# Patient Record
Sex: Female | Born: 2005 | Race: Black or African American | Hispanic: No | Marital: Single | Smoking: Never smoker
Health system: Southern US, Community
[De-identification: ages and names within clinical notes are randomized; demographics above are authoritative.]

## PROBLEM LIST (undated history)

## (undated) ENCOUNTER — Ambulatory Visit (HOSPITAL_COMMUNITY)

## (undated) DIAGNOSIS — J45909 Unspecified asthma, uncomplicated: Secondary | ICD-10-CM

---
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
## 2024-04-16 ENCOUNTER — Ambulatory Visit (INDEPENDENT_AMBULATORY_CARE_PROVIDER_SITE_OTHER)

## 2024-04-16 ENCOUNTER — Ambulatory Visit (HOSPITAL_COMMUNITY)
Admission: EM | Admit: 2024-04-16 | Discharge: 2024-04-16 | Disposition: A | Discharge status: Home / Self Care | Attending: Family Medicine | Admitting: Family Medicine

## 2024-04-16 ENCOUNTER — Encounter (HOSPITAL_COMMUNITY): Payer: Self-pay

## 2024-04-16 DIAGNOSIS — S99921A Unspecified injury of right foot, initial encounter: Secondary | ICD-10-CM

## 2024-04-16 DIAGNOSIS — S91209A Unspecified open wound of unspecified toe(s) with damage to nail, initial encounter: Secondary | ICD-10-CM | POA: Diagnosis not present

## 2024-04-16 MED ORDER — BUPIVACAINE HCL (PF) 0.5 % IJ SOLN
INTRAMUSCULAR | Status: AC
  Filled 2024-04-16: qty 10

## 2024-04-16 NOTE — ED Provider Notes (Signed)
 [facility]    CSN: [id number] Arrival date & time: 04/16/24  1705      History   Chief Complaint Chief Complaint  Patient presents with   Toe Injury    HPI Jillian Cole is a 18 y.o. female.   The history is provided by the patient.  Injury right middle toe onset 2 days ago accidentally hit toe on a bench then today hurt it on a trampoline.  States the nail has lifted up and she has had some bleeding.  Admits local swelling and tenderness.  Denies deformity, paresthesias, other injury/ No over-the-counter remedies tried.  No significant past medical history, no known drug allergies, LMP 04/14/2024  Past Medical History:  Diagnosis Date   Asthma     There are no active problems to display for this patient.   History reviewed. No pertinent surgical history.  OB History   No obstetric history on file.      Home Medications    Prior to Admission medications   Not on File    Family History History reviewed. No pertinent family history.  Social History Social History   Tobacco Use   Smoking status: Never   Smokeless tobacco: Never  Vaping Use   Vaping status: Never Used  Substance Use Topics   Alcohol use: Never   Drug use: Never     Allergies   Patient has no known allergies.   Review of Systems Review of Systems   Physical Exam Triage Vital Signs ED Triage Vitals  Encounter Vitals Group     BP 04/16/24 1754 116/73     Girls Systolic BP Percentile --      Girls Diastolic BP Percentile --      Boys Systolic BP Percentile --      Boys Diastolic BP Percentile --      Pulse Rate 04/16/24 1754 88     Resp 04/16/24 1754 16     Temp 04/16/24 1754 98.4 F (36.9 C)     Temp Source 04/16/24 1754 Oral     SpO2 04/16/24 1754 98 %     Weight --      Height 04/16/24 1754 5' 3 (1.6 m)     Head Circumference --      Peak Flow --      Pain Score 04/16/24 1752 9     Pain Loc --      Pain Education --      Exclude from Growth Chart --     No data found.  Updated Vital Signs BP 116/73 (BP Location: Right Arm)   Pulse 88   Temp 98.4 F (36.9 C) (Oral)   Resp 16   Ht 5' 3 (1.6 m)   LMP 04/14/2024 (Approximate)   SpO2 98%   Visual Acuity Right Eye Distance:   Left Eye Distance:   Bilateral Distance:    Right Eye Near:   Left Eye Near:    Bilateral Near:     Physical Exam Vitals and nursing note reviewed.  HENT:     Head: Normocephalic and atraumatic.   Musculoskeletal:     Comments: Right middle toe mild swelling and tenderness, nail avulsed, no active drainage, no deformity, neurovascular intact rest of right foot exam is normal   Neurological:     Mental Status: She is alert.      UC Treatments / Results  Labs (all labs ordered are listed, but only abnormal results are displayed) Labs Reviewed - No  data to display  EKG   Radiology No results found.  Procedures Procedures (including critical care time)  Medications Ordered in UC Medications - No data to display  Initial Impression / Assessment and Plan / UC Course  I have reviewed the triage vital signs and the nursing notes.  Pertinent labs & imaging results that were available during my care of the patient were reviewed by me and considered in my medical decision making (see chart for details).     18 year old female with injury to right third toe struck it twice over the last 3 days has nail avulsion.  She has local pain swelling and tenderness, x-ray obtained no fracture, digital nerve block performed with 0.5% Marcaine dorsal approach 1.5 mL with good results.  Once control was obtained, toenail was trimmed, proximal portion of nail replaced into position, Steri-Strip applied with benzoin. Home care and follow-up discussed recommend keeping area clean and dry for several days, ice and elevation for pain control OTC NSAIDs.  Follow-up as needed Final Clinical Impressions(s) / UC Diagnoses   Final diagnoses:  Injury of toe on right  foot, initial encounter   Discharge Instructions   None    ED Prescriptions   None    PDMP not reviewed this encounter.   S[place], [place] 04/16/24 1924

## 2024-04-16 NOTE — ED Triage Notes (Signed)
 Patient presenting with right toe injury onset 2 days ago. State the toe got jammed against a table 2 days ago. Then at the trampoline park today someone collided with the same toe and the nail was ripped off. States the injured toe is the middle toe on the right foot.   Prescriptions or OTC medications tried: No

## 2024-04-16 NOTE — Discharge Instructions (Signed)
 Over the counter NSAIDs for pain: Ibuprofen 400 mg ( Ibuprofen, Advil or Motrin, 2 tablets) and Acetaminophen 1 gram (3 of the regular strength or 2 extra strength tablets) 3-4 times a day,  take on an empty stomach before each meal and bedtime (every 6 hours) for a few days Do not take if you are pregnant/breastfeeding. Allergic to NSAIDs have a history of ulcers, intestinal bleeding or liver or kidney disease or have taken an opioid.

## 2024-04-24 ENCOUNTER — Ambulatory Visit (INDEPENDENT_AMBULATORY_CARE_PROVIDER_SITE_OTHER)

## 2024-04-24 ENCOUNTER — Encounter (HOSPITAL_COMMUNITY): Payer: Self-pay

## 2024-04-24 ENCOUNTER — Ambulatory Visit (HOSPITAL_COMMUNITY): Admission: EM | Admit: 2024-04-24 | Discharge: 2024-04-24 | Disposition: A | Discharge status: Home / Self Care

## 2024-04-24 DIAGNOSIS — S6992XA Unspecified injury of left wrist, hand and finger(s), initial encounter: Secondary | ICD-10-CM

## 2024-04-24 DIAGNOSIS — S63637A Sprain of interphalangeal joint of left little finger, initial encounter: Secondary | ICD-10-CM | POA: Diagnosis not present

## 2024-04-24 DIAGNOSIS — S91209A Unspecified open wound of unspecified toe(s) with damage to nail, initial encounter: Secondary | ICD-10-CM

## 2024-04-24 MED ORDER — IBUPROFEN 600 MG PO TABS: 600.0000 mg | ORAL_TABLET | Freq: Four times a day (QID) | ORAL | 0 refills | Status: DC | PRN

## 2024-04-24 NOTE — Discharge Instructions (Signed)
  1. Avulsion of toenail of right foot (Primary) - Nail Removal performed in UC, no toenail retained, nailbed appears to be healing appropriately, no sign of secondary infection or complication  2. Sprain of interphalangeal joint of left little finger, initial encounter - DG Finger Little Left x-ray performed in UC shows no acute fracture or dislocation, most likely finger sprain. - Apply finger splint static for protection and immobilization until injury heals -Continue to monitor symptoms for any change in severity if there is any escalation of current symptoms or development of new symptoms follow-up in ER for further evaluation and management.

## 2024-04-24 NOTE — ED Triage Notes (Signed)
 Patient presenting with right middle toe nail was injured before but she hit the nail again onset today. Also presenting with left hand pinky finger pain after playing volleyball 3 days ago.   Prescriptions or OTC medications tried: No

## 2024-04-24 NOTE — ED Provider Notes (Signed)
 [facility]  Note:  This document was prepared using Dragon voice recognition software and may include unintentional dictation errors.  MRN: [id number] DOB: October 18, 2006  Subjective:   Jillian Cole is a 18 y.o. female presenting for evaluation of left pinky finger swelling and bruising after playing volleyball 2 to 3 days ago.  Patient reports that she felt her finger bent back when she was trying to hit the ball.  Patient has good range of motion no significant joint swelling to interphalangeal joints.  Patient denies taking any over-the-counter medication to treat symptoms.  Patient also reports that she has a previous right third toe injury to her toenail.  Patient reports that toenail keeps hanging on her shoes and socks and would like it removed.  Denies any bleeding, pain, purulent drainage from the area, swelling, redness, warmth.  Patient was told that toenail would fall off eventually on its own is barely hanging on in his catching on her clothing.  I was just she had drops of the marginal previous. I just removed all  No current facility-administered medications for this encounter.  Current Outpatient Medications:    ibuprofen (ADVIL) 600 MG tablet, Take 1 tablet (600 mg total) by mouth every 6 (six) hours as needed., Disp: 30 tablet, Rfl: 0   No Known Allergies  Past Medical History:  Diagnosis Date   Asthma      History reviewed. No pertinent surgical history.  History reviewed. No pertinent family history.  Social History   Tobacco Use   Smoking status: Never   Smokeless tobacco: Never  Vaping Use   Vaping status: Never Used  Substance Use Topics   Alcohol use: Never   Drug use: Never    ROS Refer to HPI for ROS details.  Objective:   Vitals: BP 123/64 (BP Location: Right Arm)   Pulse 75   Temp 98.3 F (36.8 C) (Oral)   Resp 16   Ht 5' 3 (1.6 m)   LMP 04/14/2024 (Approximate)   SpO2 99%   Physical Exam Vitals and nursing note  reviewed.  Constitutional:      General: She is not in acute distress.    Appearance: Normal appearance. She is well-developed. She is not ill-appearing or toxic-appearing.  HENT:     Head: Normocephalic and atraumatic.   Cardiovascular:     Rate and Rhythm: Normal rate.  Pulmonary:     Effort: Pulmonary effort is normal. No respiratory distress.   Musculoskeletal:        General: Normal range of motion.     Left hand: Tenderness (Moderate bruising and pain to left fifth finger after playing volleyball 2 to 3 days ago.) and bony tenderness present. No swelling. Normal range of motion. Normal strength. Normal sensation. Normal capillary refill. Normal pulse.       Feet:  Feet:     Left foot:     Skin integrity: Skin integrity normal.   Skin:    General: Skin is warm and dry.   Neurological:     General: No focal deficit present.     Mental Status: She is alert and oriented to person, place, and time.   Psychiatric:        Mood and Affect: Mood normal.        Behavior: Behavior normal.     Nail Removal  Date/Time: 04/24/2024 3:51 PM  Performed by: Aurea Ethel NOVAK, NP Authorized by: Aurea Ethel NOVAK, NP   Consent:    Consent obtained:  Verbal   Consent given by:  Patient   Risks discussed:  Bleeding, infection and pain Universal protocol:    Patient identity confirmed:  Verbally with patient and arm band Pre-procedure details:    Skin preparation:  Povidone-iodine   Preparation: Patient was prepped and draped in the usual sterile fashion   Procedure details:    Location:  Foot   Foot location:  R third toe Anesthesia:    Anesthesia method:  None Nail Removal:    Nail removed:  Complete Trephination:    Subungual hematoma drained: no   Post-procedure details:    Dressing:  Antibiotic ointment   Procedure completion:  Tolerated well, no immediate complications   No results found for this or any previous visit (from the past 24 hours).  DG Finger  Little Left Result Date: 04/24/2024 CLINICAL DATA:  Finger injury EXAM: LEFT FINGER(S) - 3 VIEW COMPARISON:  None Available. FINDINGS: There is no evidence of fracture or dislocation. There is no evidence of arthropathy or other focal bone abnormality. Soft tissues are unremarkable. IMPRESSION: No acute fracture or dislocation. Electronically Signed   By: Limin  Xu M.D.   On: 04/24/2024 16:39     Assessment and Plan :     Discharge Instructions       1. Avulsion of toenail of right foot (Primary) - Nail Removal performed in UC, no toenail retained, nailbed appears to be healing appropriately, no sign of secondary infection or complication  2. Sprain of interphalangeal joint of left little finger, initial encounter - DG Finger Little Left x-ray performed in UC shows no acute fracture or dislocation, most likely finger sprain. - Apply finger splint static for protection and immobilization until injury heals -Continue to monitor symptoms for any change in severity if there is any escalation of current symptoms or development of new symptoms follow-up in ER for further evaluation and management.      Estie Sproule B Joelie Schou   [place], [place], [place] 04/24/24 1742

## 2024-05-04 ENCOUNTER — Ambulatory Visit (HOSPITAL_COMMUNITY)
Admission: EM | Admit: 2024-05-04 | Discharge: 2024-05-04 | Disposition: A | Discharge status: Home / Self Care | Attending: Emergency Medicine | Admitting: Emergency Medicine

## 2024-05-04 ENCOUNTER — Encounter (HOSPITAL_COMMUNITY): Payer: Self-pay

## 2024-05-04 DIAGNOSIS — J069 Acute upper respiratory infection, unspecified: Secondary | ICD-10-CM | POA: Diagnosis not present

## 2024-05-04 DIAGNOSIS — Z09 Encounter for follow-up examination after completed treatment for conditions other than malignant neoplasm: Secondary | ICD-10-CM | POA: Diagnosis not present

## 2024-05-04 MED ORDER — AZELASTINE HCL 0.1 % NA SOLN: 2.0000 | Freq: Two times a day (BID) | NASAL | 0 refills | Status: DC

## 2024-05-04 MED ORDER — BENZONATATE 100 MG PO CAPS: 100.0000 mg | ORAL_CAPSULE | Freq: Three times a day (TID) | ORAL | 0 refills | Status: DC

## 2024-05-04 NOTE — Discharge Instructions (Signed)
 As discussed I believe her symptoms are likely related to a viral respiratory illness. Take Tessalon every 8 hours as needed for cough. Use azelastine spray twice daily to help with congestion. Otherwise you can take over-the-counter Mucinex to help with your cough and congestion.  Your finger seems to be healing well.  You can continue to use the finger splint to help stabilize the joint or for comfort as needed.  In a few days I would stop wearing the splint and begin using your finger as usual to avoid stiffness or increased pain. Alternate between 650 mg of Tylenol and 400 mg ibuprofen  for any pain. Follow-up with your primary care provider or return here as needed.

## 2024-05-04 NOTE — ED Triage Notes (Signed)
 Chief Complaint: cough that is productive, congestion, runny nose, slight sore throat. Denies any fevers. Also having ongoing left pinky finger pain (sprained).   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   Onset: 1.5 weeks   Prescriptions or OTC medications tried: Yes- Alka seltzer plus cough and cold    with mild relief  New foods, medications, or products: No  Recent Travel: No

## 2024-05-04 NOTE — ED Provider Notes (Signed)
 [facility]    CSN: [id number] Arrival date & time: 05/04/24  1646      History   Chief Complaint Chief Complaint  Patient presents with   Cough   Hand Pain    HPI Jillian Cole is a 18 y.o. female.   Patient presents with productive cough, congestion, runny nose, and mild sore throat for about a week and a half.  Patient denies fever, body aches, chills, shortness of breath, chest pain, nausea, vomiting, diarrhea, and abdominal pain.    Patient states that she has been taking Alka-Seltzer plus with minimal relief.  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  Patient would also like a reevaluation of her left pinky finger.  Patient was seen here on 6/21 and was told that she sprained her pinky finger at that time.  Patient states that she has been wearing the finger splint and taking ibuprofen  and Tylenol with relief.  Patient denies any new or worsening pain.  Patient denies any swelling.  The history is provided by the patient and medical records.  Cough Hand Pain    Past Medical History:  Diagnosis Date   Asthma     There are no active problems to display for this patient.   History reviewed. No pertinent surgical history.  OB History   No obstetric history on file.      Home Medications    Prior to Admission medications   Medication Sig Start Date End Date Taking? Authorizing Provider  azelastine (ASTELIN) 0.1 % nasal spray Place 2 sprays into both nostrils 2 (two) times daily. Use in each nostril as directed 05/04/24  Yes Johnie Flaming A, NP  benzonatate (TESSALON) 100 MG capsule Take 1 capsule (100 mg total) by mouth every 8 (eight) hours. 05/04/24  Yes Johnie, Evella Kasal A, NP  ibuprofen  (ADVIL ) 600 MG tablet Take 1 tablet (600 mg total) by mouth every 6 (six) hours as needed. 04/24/24  Yes Reddick, Ethel NOVAK, NP    Family History History reviewed. No pertinent family history.  Social  History Social History   Tobacco Use   Smoking status: Never   Smokeless tobacco: Never  Vaping Use   Vaping status: Never Used  Substance Use Topics   Alcohol use: Never   Drug use: Never     Allergies   Patient has no known allergies.   Review of Systems Review of Systems  Respiratory:  Positive for cough.    Per HPI  Physical Exam Triage Vital Signs ED Triage Vitals  Encounter Vitals Group     BP 05/04/24 1700 124/78     Girls Systolic BP Percentile --      Girls Diastolic BP Percentile --      Boys Systolic BP Percentile --      Boys Diastolic BP Percentile --      Pulse Rate 05/04/24 1700 93     Resp 05/04/24 1700 18     Temp 05/04/24 1700 98 F (36.7 C)     Temp Source 05/04/24 1700 Oral     SpO2 05/04/24 1700 97 %     Weight --      Height 05/04/24 1700 5' 3 (1.6 m)     Head Circumference --      Peak Flow --      Pain Score 05/04/24 1659 5     Pain Loc --      Pain Education --  Exclude from Growth Chart --    No data found.  Updated Vital Signs BP 124/78 (BP Location: Left Arm)   Pulse 93   Temp 98 F (36.7 C) (Oral)   Resp 18   Ht 5' 3 (1.6 m)   LMP 04/14/2024 (Approximate)   SpO2 97%   Visual Acuity Right Eye Distance:   Left Eye Distance:   Bilateral Distance:    Right Eye Near:   Left Eye Near:    Bilateral Near:     Physical Exam Vitals and nursing note reviewed.  Constitutional:      General: She is awake. She is not in acute distress.    Appearance: Normal appearance. She is well-developed and well-groomed. She is not ill-appearing.  HENT:     Right Ear: Tympanic membrane, ear canal and external ear normal.     Left Ear: Tympanic membrane, ear canal and external ear normal.     Nose: Congestion and rhinorrhea present.     Mouth/Throat:     Mouth: Mucous membranes are moist.     Pharynx: Posterior oropharyngeal erythema and postnasal drip present.   Cardiovascular:     Rate and Rhythm: Normal rate and regular  rhythm.  Pulmonary:     Effort: Pulmonary effort is normal.     Breath sounds: Normal breath sounds.   Musculoskeletal:     Left hand: Tenderness present. No swelling or deformity. Normal range of motion.     Comments: Mild tenderness noted to left little finger.  Without swelling, deformity, or decreased range of motion   Skin:    General: Skin is warm and dry.   Neurological:     Mental Status: She is alert.   Psychiatric:        Behavior: Behavior is cooperative.      UC Treatments / Results  Labs (all labs ordered are listed, but only abnormal results are displayed) Labs Reviewed - No data to display  EKG   Radiology No results found.  Procedures Procedures (including critical care time)  Medications Ordered in UC Medications - No data to display  Initial Impression / Assessment and Plan / UC Course  I have reviewed the triage vital signs and the nursing notes.  Pertinent labs & imaging results that were available during my care of the patient were reviewed by me and considered in my medical decision making (see chart for details).     Patient is well-appearing.  Vitals are stable.  Upon assessment congestion and rhinorrhea present, mild erythema and PND noted to pharynx.  Lungs clear bilaterally to auscultation.  Symptoms likely viral in nature.  Prescribed Tessalon as needed for cough.  Prescribed azelastine nasal spray to help with congestion.  Discussed over-the-counter medications for symptoms.  Mild tenderness noted to the left little finger.  Without swelling, deformity, or decreased range of motion.  Finger appears to be healing well.  Recommended patient continue to use finger splint as needed.  Recommended continuing to alternate between Tylenol and ibuprofen  as needed for pain.  Discussed follow-up and return precautions Final Clinical Impressions(s) / UC Diagnoses   Final diagnoses:  Viral URI with cough  Follow-up exam     Discharge  Instructions      As discussed I believe her symptoms are likely related to a viral respiratory illness. Take Tessalon every 8 hours as needed for cough. Use azelastine spray twice daily to help with congestion. Otherwise you can take over-the-counter Mucinex to help with your cough  and congestion.  Your finger seems to be healing well.  You can continue to use the finger splint to help stabilize the joint or for comfort as needed.  In a few days I would stop wearing the splint and begin using your finger as usual to avoid stiffness or increased pain. Alternate between 650 mg of Tylenol and 400 mg ibuprofen  for any pain. Follow-up with your primary care provider or return here as needed.    ED Prescriptions     Medication Sig Dispense Auth. Provider   benzonatate (TESSALON) 100 MG capsule Take 1 capsule (100 mg total) by mouth every 8 (eight) hours. 21 capsule Johnie, Alexio Sroka A, NP   azelastine (ASTELIN) 0.1 % nasal spray Place 2 sprays into both nostrils 2 (two) times daily. Use in each nostril as directed 30 mL Johnie Flaming A, NP      PDMP not reviewed this encounter.   Johnie Flaming A, NP 05/04/24 1816

## 2024-11-05 ENCOUNTER — Encounter (HOSPITAL_COMMUNITY): Payer: Self-pay

## 2024-11-05 ENCOUNTER — Ambulatory Visit (HOSPITAL_COMMUNITY)
Admission: EM | Admit: 2024-11-05 | Discharge: 2024-11-05 | Disposition: A | Discharge status: Home / Self Care | Source: Home / Self Care

## 2024-11-05 VITALS — BP 116/75 | HR 79 | Temp 98.2°F | Resp 18

## 2024-11-05 DIAGNOSIS — U071 COVID-19: Secondary | ICD-10-CM | POA: Diagnosis not present

## 2024-11-05 LAB — POCT INFLUENZA A/B
Influenza A, POC: NEGATIVE
Influenza B, POC: NEGATIVE

## 2024-11-05 LAB — POC SOFIA SARS ANTIGEN FIA: SARS Coronavirus 2 Ag: POSITIVE — AB

## 2024-11-05 MED ORDER — PSEUDOEPH-BROMPHEN-DM 30-2-10 MG/5ML PO SYRP: 10.0000 mL | ORAL_SOLUTION | Freq: Four times a day (QID) | ORAL | 0 refills | Status: AC | PRN

## 2024-11-05 NOTE — ED Provider Notes (Signed)
 " [facility]    CSN: [id number] Arrival date & time: 11/05/24  1558      History   Chief Complaint Chief Complaint  Patient presents with   Fever   Fatigue   Cough    HPI Jillian Cole is a 19 y.o. female.   Discussed the use of AI scribe software for clinical note transcription with the patient, who gave verbal consent to proceed.   The patient presents with cough, fever, and fatigue that have been ongoing for approximately four days. She also reports a sore throat that began about two days ago. Associated symptoms include rhinorrhea, nasal congestion, and sneezing. She is producing sputum with cough and notes shortness of breath that occurs only during coughing episodes, with no shortness of breath at rest or with routine activity. She denies wheezing, nausea, vomiting, diarrhea, or headaches.  The patient reports a recent close exposure to COVID-19 through her grandmother, who tested positive four days ago. She has attempted symptom management at home with Alka-Seltzer without significant relief.  The following sections of the patient's history were reviewed and updated as appropriate: allergies, current medications, past family history, past medical history, past social history, past surgical history, and problem list.     Past Medical History:  Diagnosis Date   Asthma     There are no active problems to display for this patient.   History reviewed. No pertinent surgical history.  OB History   No obstetric history on file.      Home Medications    Prior to Admission medications  Medication Sig Start Date End Date Taking? Authorizing Provider  brompheniramine-pseudoephedrine-DM 30-2-10 MG/5ML syrup Take 10 mLs by mouth every 6 (six) hours as needed (cough and congestion). 11/05/24  Yes Iola Lukes, FNP    Family History History reviewed. No pertinent family history.  Social History Social History[1]   Allergies   Patient has no known  allergies.   Review of Systems Review of Systems  Constitutional:  Positive for fatigue and fever (subjective).  HENT:  Positive for congestion, rhinorrhea, sneezing and sore throat.   Respiratory:  Positive for cough and shortness of breath (only when couging). Negative for wheezing.   Gastrointestinal:  Positive for diarrhea. Negative for nausea and vomiting.  Neurological:  Positive for headaches.  All other systems reviewed and are negative.    Physical Exam Triage Vital Signs ED Triage Vitals [11/05/24 1621]  Encounter Vitals Group     BP 116/75     Girls Systolic BP Percentile      Girls Diastolic BP Percentile      Boys Systolic BP Percentile      Boys Diastolic BP Percentile      Pulse Rate 79     Resp 18     Temp 98.2 F (36.8 C)     Temp Source Oral     SpO2      Weight      Height      Head Circumference      Peak Flow      Pain Score      Pain Loc      Pain Education      Exclude from Growth Chart    No data found.  Updated Vital Signs BP 116/75 (BP Location: Left Arm)   Pulse 79   Temp 98.2 F (36.8 C) (Oral)   Resp 18   LMP 10/22/2024 (Approximate)   Visual Acuity Right Eye Distance:   Left  Eye Distance:   Bilateral Distance:    Right Eye Near:   Left Eye Near:    Bilateral Near:     Physical Exam Vitals reviewed.  Constitutional:      General: She is awake. She is not in acute distress.    Appearance: Normal appearance. She is well-developed. She is not ill-appearing, toxic-appearing or diaphoretic.  HENT:     Head: Normocephalic.     Right Ear: Hearing normal.     Left Ear: Hearing normal.     Nose: Nose normal.     Mouth/Throat:     Mouth: Mucous membranes are moist.  Eyes:     General: Vision grossly intact.     Conjunctiva/sclera: Conjunctivae normal.  Cardiovascular:     Rate and Rhythm: Normal rate and regular rhythm.     Heart sounds: Normal heart sounds.  Pulmonary:     Effort: Pulmonary effort is normal.     Breath  sounds: Normal breath sounds and air entry.  Musculoskeletal:        General: Normal range of motion.     Cervical back: Normal range of motion and neck supple.  Skin:    General: Skin is warm and dry.  Neurological:     General: No focal deficit present.     Mental Status: She is alert and oriented to person, place, and time.  Psychiatric:        Speech: Speech normal.        Behavior: Behavior is cooperative.      UC Treatments / Results  Labs (all labs ordered are listed, but only abnormal results are displayed) Labs Reviewed  POC SOFIA SARS ANTIGEN FIA - Abnormal; Notable for the following components:      Result Value   SARS Coronavirus 2 Ag Positive (*)    All other components within normal limits  POCT INFLUENZA A/B    EKG   Radiology No results found.  Procedures Procedures (including critical care time)  Medications Ordered in UC Medications - No data to display  Initial Impression / Assessment and Plan / UC Course  I have reviewed the triage vital signs and the nursing notes.  Pertinent labs & imaging results that were available during my care of the patient were reviewed by me and considered in my medical decision making (see chart for details).     The patient presents with symptoms of an acute respiratory illness and tested positive for COVID-19. The patient should maintain hydration and get adequate rest. Education was provided regarding the current understanding of COVID-19, which has generally become less severe compared to earlier stages of the pandemic due to increased immunity and viral evolution. The patient was counseled on updated CDC guidance, which no longer requires a strict five-day isolation period. Instead, return to normal activities is appropriate once symptoms are improving overall and the patient has been fever-free for at least 24 hours without fever-reducing medications. Close monitoring of symptoms over the next several days was  advised, with follow-up with primary care if symptoms are not improving within a week. The patient should seek emergency care for shortness of breath, chest pain, dizziness, fainting, confusion, severe weakness, or inability to keep fluids down.  Today's evaluation has revealed no signs of a dangerous process. Discussed diagnosis with patient and/or guardian. Patient and/or guardian aware of their diagnosis, possible red flag symptoms to watch out for and need for close follow up. Patient and/or guardian understands verbal and written discharge instructions.  Patient and/or guardian comfortable with plan and disposition.  Patient and/or guardian has a clear mental status at this time, good insight into illness (after discussion and teaching) and has clear judgment to make decisions regarding their care  Documentation was completed with the aid of voice recognition software. Transcription may contain typographical errors.  Final Clinical Impressions(s) / UC Diagnoses   Final diagnoses:  COVID-19     Discharge Instructions      You tested positive for COVID-19, which is caused by a virus. Antibiotics are not used because they only treat bacterial infections, not viral ones. Take any prescribed medications as directed, drink plenty of fluids, and get plenty of rest. COVID-19 illness has generally become less severe compared to earlier in the pandemic, largely due to immunity from vaccines, prior infections, and changes in the virus itself. The CDC no longer requires a 5-day isolation period after a positive test. You may return to normal activities once your symptoms are improving overall and you have been fever-free for at least 24 hours without using fever reducers like Tylenol or ibuprofen . Monitor your symptoms closely, and if they are not improving within a week, follow up with your primary care provider. Go to the emergency department right away if you develop trouble breathing, chest pain,  dizziness, fainting, confusion, severe weakness, or if you are unable to keep fluids down.     ED Prescriptions     Medication Sig Dispense Auth. Provider   brompheniramine-pseudoephedrine-DM 30-2-10 MG/5ML syrup Take 10 mLs by mouth every 6 (six) hours as needed (cough and congestion). 120 mL Iola Lukes, FNP      PDMP not reviewed this encounter.     [1]  Social History Tobacco Use   Smoking status: Never   Smokeless tobacco: Never  Vaping Use   Vaping status: Never Used  Substance Use Topics   Alcohol use: Never   Drug use: Never     Iola Lukes, FNP 11/05/24 1947  "

## 2024-11-05 NOTE — ED Triage Notes (Signed)
 Patient presents to the office for cough,fatigued,fever and sore throat x 1 week.  Patient has been using Alka-Selter.

## 2024-11-05 NOTE — Discharge Instructions (Signed)
 You tested positive for COVID-19, which is caused by a virus. Antibiotics are not used because they only treat bacterial infections, not viral ones. Take any prescribed medications as directed, drink plenty of fluids, and get plenty of rest. COVID-19 illness has generally become less severe compared to earlier in the pandemic, largely due to immunity from vaccines, prior infections, and changes in the virus itself. The CDC no longer requires a 5-day isolation period after a positive test. You may return to normal activities once your symptoms are improving overall and you have been fever-free for at least 24 hours without using fever reducers like Tylenol  or ibuprofen . Monitor your symptoms closely, and if they are not improving within a week, follow up with your primary care provider. Go to the emergency department right away if you develop trouble breathing, chest pain, dizziness, fainting, confusion, severe weakness, or if you are unable to keep fluids down.
